# Patient Record
Sex: Female | Born: 1960 | Race: White | Hispanic: No | Marital: Married | Smoking: Never smoker
Health system: Southern US, Community
[De-identification: ages and names within clinical notes are randomized; demographics above are authoritative.]

## PROBLEM LIST (undated history)

## (undated) DIAGNOSIS — I10 Essential (primary) hypertension: Secondary | ICD-10-CM

## (undated) HISTORY — PX: ABDOMINAL SURGERY: SHX537

---

## 2004-04-21 ENCOUNTER — Emergency Department: Payer: Self-pay | Admitting: Emergency Medicine

## 2005-03-29 ENCOUNTER — Emergency Department: Payer: Self-pay | Admitting: Emergency Medicine

## 2005-08-10 ENCOUNTER — Ambulatory Visit: Payer: Self-pay | Admitting: Family Medicine

## 2006-05-14 ENCOUNTER — Emergency Department: Payer: Self-pay | Admitting: Emergency Medicine

## 2007-04-06 ENCOUNTER — Emergency Department: Payer: Self-pay | Admitting: Emergency Medicine

## 2007-05-10 ENCOUNTER — Ambulatory Visit: Payer: Self-pay | Admitting: Family Medicine

## 2007-12-12 ENCOUNTER — Ambulatory Visit: Payer: Self-pay

## 2008-10-07 ENCOUNTER — Emergency Department: Payer: Self-pay | Admitting: Emergency Medicine

## 2009-03-18 ENCOUNTER — Ambulatory Visit: Payer: Self-pay

## 2009-06-17 ENCOUNTER — Emergency Department: Payer: Self-pay | Admitting: Unknown Physician Specialty

## 2010-07-14 ENCOUNTER — Ambulatory Visit: Payer: Self-pay

## 2011-08-16 ENCOUNTER — Ambulatory Visit: Payer: Self-pay

## 2012-06-20 ENCOUNTER — Emergency Department: Payer: Self-pay | Admitting: Emergency Medicine

## 2012-06-20 LAB — HEMOGLOBIN: HGB: 12.6 g/dL (ref 12.0–16.0)

## 2012-12-26 ENCOUNTER — Ambulatory Visit: Payer: Self-pay

## 2013-07-16 ENCOUNTER — Emergency Department: Payer: Self-pay | Admitting: Emergency Medicine

## 2013-07-16 LAB — CBC WITH DIFFERENTIAL/PLATELET
Basophil #: 0.1 10*3/uL (ref 0.0–0.1)
Basophil %: 1 %
EOS PCT: 2 %
Eosinophil #: 0.1 10*3/uL (ref 0.0–0.7)
HCT: 43.8 % (ref 35.0–47.0)
HGB: 14.8 g/dL (ref 12.0–16.0)
Lymphocyte #: 2 10*3/uL (ref 1.0–3.6)
Lymphocyte %: 31.9 %
MCH: 29.7 pg (ref 26.0–34.0)
MCHC: 33.8 g/dL (ref 32.0–36.0)
MCV: 88 fL (ref 80–100)
MONOS PCT: 6.1 %
Monocyte #: 0.4 x10 3/mm (ref 0.2–0.9)
Neutrophil #: 3.7 10*3/uL (ref 1.4–6.5)
Neutrophil %: 59 %
PLATELETS: 177 10*3/uL (ref 150–440)
RBC: 4.98 10*6/uL (ref 3.80–5.20)
RDW: 13.5 % (ref 11.5–14.5)
WBC: 6.3 10*3/uL (ref 3.6–11.0)

## 2013-07-16 LAB — URINALYSIS, COMPLETE
BILIRUBIN, UR: NEGATIVE
BLOOD: NEGATIVE
Bacteria: NONE SEEN
Glucose,UR: NEGATIVE mg/dL (ref 0–75)
KETONE: NEGATIVE
Leukocyte Esterase: NEGATIVE
Nitrite: NEGATIVE
PH: 5 (ref 4.5–8.0)
Protein: NEGATIVE
Specific Gravity: 1.019 (ref 1.003–1.030)
Squamous Epithelial: 3
WBC UR: 1 /HPF (ref 0–5)

## 2013-07-16 LAB — BASIC METABOLIC PANEL
ANION GAP: 3 — AB (ref 7–16)
BUN: 13 mg/dL (ref 7–18)
CO2: 30 mmol/L (ref 21–32)
Calcium, Total: 9.4 mg/dL (ref 8.5–10.1)
Chloride: 102 mmol/L (ref 98–107)
Creatinine: 0.6 mg/dL (ref 0.60–1.30)
EGFR (African American): >60
GLUCOSE: 99 mg/dL (ref 65–99)
Osmolality: 270 (ref 275–301)
Potassium: 4.2 mmol/L (ref 3.5–5.1)
SODIUM: 135 mmol/L — AB (ref 136–145)

## 2013-07-16 LAB — TROPONIN I

## 2014-03-05 ENCOUNTER — Ambulatory Visit: Payer: Self-pay

## 2014-08-06 ENCOUNTER — Emergency Department: Payer: Self-pay | Admitting: Emergency Medicine

## 2018-03-12 ENCOUNTER — Other Ambulatory Visit: Payer: Self-pay | Admitting: Family Medicine

## 2018-03-12 DIAGNOSIS — Z1231 Encounter for screening mammogram for malignant neoplasm of breast: Secondary | ICD-10-CM

## 2018-03-26 ENCOUNTER — Encounter: Payer: Self-pay | Admitting: Radiology

## 2018-03-26 ENCOUNTER — Ambulatory Visit
Admission: RE | Admit: 2018-03-26 | Discharge: 2018-03-26 | Disposition: A | Discharge status: Home / Self Care | Payer: BLUE CROSS/BLUE SHIELD | Source: Ambulatory Visit | Attending: Family Medicine | Admitting: Family Medicine

## 2018-03-26 DIAGNOSIS — Z1231 Encounter for screening mammogram for malignant neoplasm of breast: Secondary | ICD-10-CM | POA: Diagnosis not present

## 2018-11-09 ENCOUNTER — Encounter: Payer: Self-pay | Admitting: Emergency Medicine

## 2018-11-09 ENCOUNTER — Other Ambulatory Visit: Payer: Self-pay

## 2018-11-09 ENCOUNTER — Emergency Department
Admission: EM | Admit: 2018-11-09 | Discharge: 2018-11-09 | Disposition: A | Discharge status: Home / Self Care | Payer: BLUE CROSS/BLUE SHIELD | Attending: Emergency Medicine | Admitting: Emergency Medicine

## 2018-11-09 ENCOUNTER — Emergency Department: Payer: BLUE CROSS/BLUE SHIELD

## 2018-11-09 DIAGNOSIS — R0789 Other chest pain: Secondary | ICD-10-CM | POA: Diagnosis not present

## 2018-11-09 DIAGNOSIS — I10 Essential (primary) hypertension: Secondary | ICD-10-CM | POA: Insufficient documentation

## 2018-11-09 DIAGNOSIS — R079 Chest pain, unspecified: Secondary | ICD-10-CM | POA: Diagnosis present

## 2018-11-09 LAB — BASIC METABOLIC PANEL
Anion gap: 11 (ref 5–15)
BUN: 19 mg/dL (ref 6–20)
CO2: 24 mmol/L (ref 22–32)
Calcium: 8.8 mg/dL — ABNORMAL LOW (ref 8.9–10.3)
Chloride: 104 mmol/L (ref 98–111)
Creatinine, Ser: 0.45 mg/dL (ref 0.44–1.00)
GFR calc Af Amer: >60 mL/min (ref >60)
GFR calc non Af Amer: >60 mL/min (ref >60)
Glucose, Bld: 170 mg/dL — ABNORMAL HIGH (ref 70–99)
Potassium: 3.6 mmol/L (ref 3.5–5.1)
Sodium: 139 mmol/L (ref 135–145)

## 2018-11-09 LAB — CBC WITH DIFFERENTIAL/PLATELET
Abs Immature Granulocytes: 0.02 10*3/uL (ref 0.00–0.07)
Basophils Absolute: 0 10*3/uL (ref 0.0–0.1)
Basophils Relative: 1 %
Eosinophils Absolute: 0.1 10*3/uL (ref 0.0–0.5)
Eosinophils Relative: 2 %
HCT: 39.1 % (ref 36.0–46.0)
Hemoglobin: 13.2 g/dL (ref 12.0–15.0)
Immature Granulocytes: 0 %
Lymphocytes Relative: 30 %
Lymphs Abs: 1.5 10*3/uL (ref 0.7–4.0)
MCH: 29.7 pg (ref 26.0–34.0)
MCHC: 33.8 g/dL (ref 30.0–36.0)
MCV: 87.9 fL (ref 80.0–100.0)
Monocytes Absolute: 0.4 10*3/uL (ref 0.1–1.0)
Monocytes Relative: 7 %
Neutro Abs: 3.1 10*3/uL (ref 1.7–7.7)
Neutrophils Relative %: 60 %
Platelets: 136 10*3/uL — ABNORMAL LOW (ref 150–400)
RBC: 4.45 MIL/uL (ref 3.87–5.11)
RDW: 13.2 % (ref 11.5–15.5)
WBC: 5.1 10*3/uL (ref 4.0–10.5)
nRBC: 0 % (ref 0.0–0.2)

## 2018-11-09 LAB — TROPONIN I: Troponin I: <0.03 ng/mL (ref <0.03)

## 2018-11-09 NOTE — ED Provider Notes (Signed)
[facility] Emergency Department Provider Note  ____________________________________________  Time seen: Approximately 11:45 AM  I have reviewed the triage vital signs and the nursing notes.   HISTORY  Chief Complaint Numbness and Shortness of Breath    HPI Rose Noble is a 58 y.o. female with a past medical history of hypertension who comes the ED complaining of shortness of breath, vague anterior chest pain, and paresthesia of the right arm for the past week.  Onset of symptoms was 1 week ago several hours after she had been doing a lot of strenuous yard work lifting and moving heavy bags of mulch just brought around her plants.  She did not have any significant symptoms or discomfort during the activity itself, but later in the day she developed anterior chest soreness and feeling more short of breath.  Denies cough fevers chills or body aches, no sick contacts.  Pain is not exertional, not radiating.  Not pleuritic.      Past medical history significant for hypertension  There are no active problems to display for this patient.    Past surgical history noncontributory   Prior to Admission medications   Not on File  Benazepril Hydrochlorothiazide   Allergies Patient has no known allergies.   Family History  Problem Relation Age of Onset  . Breast cancer Maternal Aunt   . Breast cancer Maternal Aunt     Social History Social History   Tobacco Use  . Smoking status: Never Smoker  . Smokeless tobacco: Never Used  Substance Use Topics  . Alcohol use: Never    Frequency: Never  . Drug use: Never    Review of Systems  Constitutional:   No fever or chills.  ENT:   No sore throat. No rhinorrhea. Cardiovascular: Positive as above chest discomfort without syncope. Respiratory:   Positive intermittent shortness of breath without cough. Gastrointestinal:   Negative for abdominal pain, vomiting and diarrhea.  Musculoskeletal:    Negative for focal pain or swelling All other systems reviewed and are negative except as documented above in ROS and HPI.  ____________________________________________   PHYSICAL EXAM:  VITAL SIGNS: ED Triage Vitals  Enc Vitals Group     BP 11/09/18 1014 (!) 162/86     Pulse Rate 11/09/18 1014 83     Resp 11/09/18 1014 18     Temp 11/09/18 1014 98.1 F (36.7 C)     Temp Source 11/09/18 1014 Oral     SpO2 11/09/18 1014 97 %     Weight 11/09/18 1008 160 lb (72.6 kg)     Height 11/09/18 1008 5\' 1"  (1.549 m)     Head Circumference --      Peak Flow --      Pain Score 11/09/18 1008 0     Pain Loc --      Pain Edu? --      Excl. in [place]? --     Vital signs reviewed, nursing assessments reviewed.   Constitutional:   Alert and oriented. Non-toxic appearance. Eyes:   Conjunctivae are normal. EOMI. PERRL. ENT      Head:   Normocephalic and atraumatic.      Nose:   No congestion/rhinnorhea.       Mouth/Throat:   MMM, no pharyngeal erythema. No peritonsillar mass.       Neck:   No meningismus. Full ROM. Hematological/Lymphatic/Immunilogical:   No cervical lymphadenopathy. Cardiovascular:   RRR. Symmetric bilateral radial and DP pulses.  No murmurs. Cap refill  less than 2 seconds. Respiratory:   Normal respiratory effort without tachypnea/retractions. Breath sounds are clear and equal bilaterally. No wheezes/rales/rhonchi. Gastrointestinal:   Soft and nontender. Non distended. There is no CVA tenderness.  No rebound, rigidity, or guarding. Musculoskeletal:   Normal range of motion in all extremities. No joint effusions.  No lower extremity tenderness.  No edema. Neurologic:   Normal speech and language.  Motor grossly intact. No acute focal neurologic deficits are appreciated.  Skin:    Skin is warm, dry and intact. No rash noted.  No petechiae, purpura, or bullae.  ____________________________________________    LABS (pertinent positives/negatives) (all labs ordered are  listed, but only abnormal results are displayed) Labs Reviewed  BASIC METABOLIC PANEL - Abnormal; Notable for the following components:      Result Value   Glucose, Bld 170 (*)    Calcium 8.8 (*)    All other components within normal limits  CBC WITH DIFFERENTIAL/PLATELET - Abnormal; Notable for the following components:   Platelets 136 (*)    All other components within normal limits  TROPONIN I   ____________________________________________   EKG  Interpreted by me  Date: 11/09/2018  Rate: 74  Rhythm: normal sinus rhythm  QRS Axis: normal  Intervals: normal  ST/T Wave abnormalities: normal  Conduction Disutrbances: none  Narrative Interpretation: unremarkable      ____________________________________________    RADIOLOGY  Dg Chest Portable 1 View  Result Date: 11/09/2018 CLINICAL DATA:  Shortness of breath, numbness in the right arm for 1-2 weeks EXAM: PORTABLE CHEST 1 VIEW COMPARISON:  06/17/2009 FINDINGS: The heart size and mediastinal contours are within normal limits. Both lungs are clear. The visualized skeletal structures are unremarkable. IMPRESSION: No active disease. Electronically Signed   By: Elige Ko   On: 11/09/2018 11:21    ____________________________________________   PROCEDURES Procedures  ____________________________________________  DIFFERENTIAL DIAGNOSIS   Muscle strain, fatigue, non-STEMI, pneumonia  CLINICAL IMPRESSION / ASSESSMENT AND PLAN / ED COURSE  Medications ordered in the ED: Medications - No data to display  Pertinent labs & imaging results that were available during my care of the patient were reviewed by me and considered in my medical decision making (see chart for details).  Rose Noble was evaluated in Emergency Department on 11/09/2018 for the symptoms described in the history of present illness. She was evaluated in the context of the global COVID-19 pandemic, which necessitated consideration that the patient  might be at risk for infection with the SARS-CoV-2 virus that causes COVID-19. Institutional protocols and algorithms that pertain to the evaluation of patients at risk for COVID-19 are in a state of rapid change based on information released by regulatory bodies including the CDC and federal and state organizations. These policies and algorithms were followed during the patient's care in the ED.   Patient presents with atypical chest discomfort, most likely muscle strain related to yard work.  Vital signs unremarkable.  EKG and chest x-ray unremarkable.  Labs are all normal.  Given symptoms for greater than a week, the negative work-up is very reassuring and no further cardiac work-up is needed at this time.Considering the patient's symptoms, medical history, and physical examination today, I have low suspicion for ACS, PE, TAD, pneumothorax, carditis, mediastinitis, pneumonia, CHF, or sepsis.  Plan for discharge home and follow-up with primary care and cardiology.      ____________________________________________   FINAL CLINICAL IMPRESSION(S) / ED DIAGNOSES    Final diagnoses:  Atypical chest pain  ED Discharge Orders    None      Portions of this note were generated with dragon dictation software. Dictation errors may occur despite best attempts at proofreading.   Sharman Cheek, MD 11/09/18 1149

## 2018-11-09 NOTE — Discharge Instructions (Signed)
Your EKG, labs, and chest x-ray are all okay today.  Please follow-up with your primary care doctor for continued monitoring of your symptoms.  Please follow-up with cardiology for further evaluation of your risk of heart disease.

## 2018-11-09 NOTE — ED Triage Notes (Signed)
C/o SHOB and numbness in right arm for 1-2 weeks. Thinks may have pulled something working in yard. Unlabored at check in. Ambulatory. No chest pain.

## 2018-11-12 ENCOUNTER — Telehealth: Payer: Self-pay

## 2018-11-12 NOTE — Telephone Encounter (Signed)
Spoke with patient to schedule [facility] ed fu chest pain.  Ok to add on per dr. [name] to this afternoon as a new virtual.  Patient driving and will call back to schedule.

## 2019-04-08 ENCOUNTER — Other Ambulatory Visit: Payer: Self-pay | Admitting: Family Medicine

## 2019-04-08 DIAGNOSIS — Z1231 Encounter for screening mammogram for malignant neoplasm of breast: Secondary | ICD-10-CM

## 2019-07-10 ENCOUNTER — Ambulatory Visit
Admission: RE | Admit: 2019-07-10 | Discharge: 2019-07-10 | Disposition: A | Discharge status: Home / Self Care | Payer: BLUE CROSS/BLUE SHIELD | Source: Ambulatory Visit | Attending: Family Medicine | Admitting: Family Medicine

## 2019-07-10 DIAGNOSIS — Z1231 Encounter for screening mammogram for malignant neoplasm of breast: Secondary | ICD-10-CM | POA: Insufficient documentation

## 2019-12-10 ENCOUNTER — Emergency Department
Admission: EM | Admit: 2019-12-10 | Discharge: 2019-12-10 | Disposition: A | Discharge status: Home / Self Care | Payer: Worker's Compensation | Attending: Student in an Organized Health Care Education/Training Program | Admitting: Student in an Organized Health Care Education/Training Program

## 2019-12-10 ENCOUNTER — Emergency Department: Payer: Worker's Compensation

## 2019-12-10 ENCOUNTER — Other Ambulatory Visit: Payer: Self-pay

## 2019-12-10 DIAGNOSIS — Y990 Civilian activity done for income or pay: Secondary | ICD-10-CM | POA: Diagnosis not present

## 2019-12-10 DIAGNOSIS — M79604 Pain in right leg: Secondary | ICD-10-CM

## 2019-12-10 DIAGNOSIS — Y9289 Other specified places as the place of occurrence of the external cause: Secondary | ICD-10-CM | POA: Diagnosis not present

## 2019-12-10 DIAGNOSIS — W01198A Fall on same level from slipping, tripping and stumbling with subsequent striking against other object, initial encounter: Secondary | ICD-10-CM | POA: Diagnosis not present

## 2019-12-10 DIAGNOSIS — Y9389 Activity, other specified: Secondary | ICD-10-CM | POA: Insufficient documentation

## 2019-12-10 MED ORDER — IBUPROFEN 600 MG PO TABS: 600.0000 mg | ORAL_TABLET | Freq: Three times a day (TID) | ORAL | 0 refills | Status: AC | PRN

## 2019-12-10 MED ORDER — TRAMADOL HCL 50 MG PO TABS: 50.0000 mg | ORAL_TABLET | Freq: Four times a day (QID) | ORAL | 0 refills | Status: AC | PRN

## 2019-12-10 NOTE — ED Triage Notes (Signed)
Patient to ER for c/o right leg pain (just below knee) after falling at work. Patient states a customer had their foot out in the aisle, patient tripped over foot and fell. No obvious deformity present.  Patient's manager, Raynelle Fanning @ [employer], states no testing needed for [employer] [phone]).

## 2019-12-10 NOTE — ED Provider Notes (Signed)
[facility] Emergency Department Provider Note   ____________________________________________   First MD Initiated Contact with Patient 12/10/19 1344     (approximate)  I have reviewed the triage vital signs and the nursing notes.   HISTORY  Chief Complaint Leg Pain    HPI Rose Noble is a 59 y.o. female patient complain of right leg pain secondary to a trip and fall.  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  Patient said a customer had his foot out and she tripped over it.  Patient day prior to arrival she had a large hematoma which is receding now.  Patient is able to weight-bear.  Patient rates the pain as a 6/10.  Patient described the pain as "achy".  Ice pack applied at work.     History reviewed. No pertinent past medical history.  There are no problems to display for this patient.   History reviewed. No pertinent surgical history.  Prior to Admission medications   Medication Sig Start Date End Date Taking? Authorizing Provider  ibuprofen (ADVIL) 600 MG tablet Take 1 tablet (600 mg total) by mouth every 8 (eight) hours as needed. 12/10/19   Sable Feil, PA-C  traMADol (ULTRAM) 50 MG tablet Take 1 tablet (50 mg total) by mouth every 6 (six) hours as needed for up to 3 days. 12/10/19 12/13/19  Sable Feil, PA-C    Allergies Patient has no known allergies.  Family History  Problem Relation Age of Onset   Breast cancer Maternal Aunt    Breast cancer Maternal Aunt     Social History Social History   Tobacco Use   Smoking status: Never Smoker   Smokeless tobacco: Never Used  Substance Use Topics   Alcohol use: Never   Drug use: Never    Review of Systems Constitutional: No fever/chills Eyes: No visual changes. ENT: No sore throat. Cardiovascular: Denies chest pain. Respiratory: Denies shortness of breath. Gastrointestinal: No abdominal pain.  No nausea, no vomiting.  No diarrhea.  No  constipation. Genitourinary: Negative for dysuria. Musculoskeletal: Right leg pain. Skin: Negative for rash. Neurological: Negative for headaches, focal weakness or numbness.   ____________________________________________   PHYSICAL EXAM:  VITAL SIGNS: ED Triage Vitals  Enc Vitals Group     BP 12/10/19 1333 (!) 165/77     Pulse Rate 12/10/19 1333 78     Resp 12/10/19 1333 18     Temp 12/10/19 1333 98.6 F (37 C)     Temp Source 12/10/19 1333 Oral     SpO2 12/10/19 1333 96 %     Weight 12/10/19 1329 157 lb (71.2 kg)     Height 12/10/19 1329 5\' 1"  (1.549 m)     Head Circumference --      Peak Flow --      Pain Score 12/10/19 1328 6     Pain Loc --      Pain Edu? --      Excl. in [place]? --    Constitutional: Alert and oriented. Well appearing and in no acute distress. Cardiovascular: Normal rate, regular rhythm. Grossly normal heart sounds.  Good peripheral circulation.  Elevated blood pressure. Respiratory: Normal respiratory effort.  No retractions. Lungs CTAB. Musculoskeletal: No obvious deformity to the right tib-fib.  Patient is moderate guarding palpation distal third right tib-fib area.   Neurologic:  Normal speech and language. No gross focal neurologic deficits are appreciated. No gait instability. Skin:  Skin is warm, dry and intact.  No rash noted. Psychiatric: Mood and affect are normal. Speech and behavior are normal.  ____________________________________________   LABS (all labs ordered are listed, but only abnormal results are displayed)  Labs Reviewed - No data to display ____________________________________________  EKG   ____________________________________________  RADIOLOGY  ED MD interpretation:    Official radiology report(s): No results found.  ____________________________________________   PROCEDURES  Procedure(s) performed (including Critical Care):  Procedures   ____________________________________________   INITIAL IMPRESSION  / ASSESSMENT AND PLAN / ED COURSE  As part of my medical decision making, I reviewed the following data within the electronic MEDICAL RECORD NUMBER     Patient presents with distal surgery right leg pain secondary to a trip and fall.  Discussed x-ray findings revealing no fractures.  Patient complaint physical exam consistent with contusion.  Patient given discharge care instruction and a work note.  Patient advised follow-up PCP if no improvement 2 to 3 days.    Jenniger Figiel was evaluated in Emergency Department on 12/10/2019 for the symptoms described in the history of present illness. She was evaluated in the context of the global COVID-19 pandemic, which necessitated consideration that the patient might be at risk for infection with the SARS-CoV-2 virus that causes COVID-19. Institutional protocols and algorithms that pertain to the evaluation of patients at risk for COVID-19 are in a state of rapid change based on information released by regulatory bodies including the CDC and federal and state organizations. These policies and algorithms were followed during the patient's care in the ED.       ____________________________________________   FINAL CLINICAL IMPRESSION(S) / ED DIAGNOSES  Final diagnoses:  Right leg pain     ED Discharge Orders         Ordered    ibuprofen (ADVIL) 600 MG tablet  Every 8 hours PRN     12/10/19 1435    traMADol (ULTRAM) 50 MG tablet  Every 6 hours PRN     12/10/19 1435           Note:  This document was prepared using Dragon voice recognition software and may include unintentional dictation errors.    Joni Reining, PA-C 12/10/19 1440    Willy Eddy, MD 12/10/19 1539

## 2019-12-10 NOTE — Discharge Instructions (Signed)
Follow discharge care instruction take medication as directed. °

## 2019-12-10 NOTE — ED Notes (Signed)
See triage note  Presents s/p trip and fall    Having pain to right lower leg

## 2020-03-28 ENCOUNTER — Encounter: Payer: Self-pay | Admitting: Emergency Medicine

## 2020-03-28 ENCOUNTER — Emergency Department
Admission: EM | Admit: 2020-03-28 | Discharge: 2020-03-28 | Disposition: A | Discharge status: Home / Self Care | Payer: BLUE CROSS/BLUE SHIELD | Attending: Emergency Medicine | Admitting: Emergency Medicine

## 2020-03-28 ENCOUNTER — Other Ambulatory Visit: Payer: Self-pay

## 2020-03-28 ENCOUNTER — Emergency Department: Payer: BLUE CROSS/BLUE SHIELD

## 2020-03-28 DIAGNOSIS — I10 Essential (primary) hypertension: Secondary | ICD-10-CM | POA: Insufficient documentation

## 2020-03-28 DIAGNOSIS — R55 Syncope and collapse: Secondary | ICD-10-CM | POA: Insufficient documentation

## 2020-03-28 DIAGNOSIS — H8111 Benign paroxysmal vertigo, right ear: Secondary | ICD-10-CM | POA: Diagnosis not present

## 2020-03-28 DIAGNOSIS — R42 Dizziness and giddiness: Secondary | ICD-10-CM | POA: Diagnosis present

## 2020-03-28 HISTORY — DX: Essential (primary) hypertension: I10

## 2020-03-28 LAB — BASIC METABOLIC PANEL
Anion gap: 9 (ref 5–15)
BUN: 16 mg/dL (ref 6–20)
CO2: 27 mmol/L (ref 22–32)
Calcium: 9.8 mg/dL (ref 8.9–10.3)
Chloride: 102 mmol/L (ref 98–111)
Creatinine, Ser: 0.57 mg/dL (ref 0.44–1.00)
GFR calc Af Amer: >60 mL/min (ref >60)
GFR calc non Af Amer: >60 mL/min (ref >60)
Glucose, Bld: 110 mg/dL — ABNORMAL HIGH (ref 70–99)
Potassium: 4 mmol/L (ref 3.5–5.1)
Sodium: 138 mmol/L (ref 135–145)

## 2020-03-28 LAB — CBC
HCT: 42 % (ref 36.0–46.0)
Hemoglobin: 14.4 g/dL (ref 12.0–15.0)
MCH: 30.3 pg (ref 26.0–34.0)
MCHC: 34.3 g/dL (ref 30.0–36.0)
MCV: 88.2 fL (ref 80.0–100.0)
Platelets: 142 K/uL — ABNORMAL LOW (ref 150–400)
RBC: 4.76 MIL/uL (ref 3.87–5.11)
RDW: 13.1 % (ref 11.5–15.5)
WBC: 5.5 K/uL (ref 4.0–10.5)
nRBC: 0 % (ref 0.0–0.2)

## 2020-03-28 LAB — TROPONIN I (HIGH SENSITIVITY)
Troponin I (High Sensitivity): 4 ng/L (ref <18)
Troponin I (High Sensitivity): 5 ng/L

## 2020-03-28 MED ORDER — MECLIZINE HCL 25 MG PO TABS
25.0000 mg | ORAL_TABLET | Freq: Once | ORAL | Status: AC
  Administered 2020-03-28: 25 mg via ORAL
  Filled 2020-03-28: qty 1

## 2020-03-28 MED ORDER — MECLIZINE HCL 25 MG PO TABS: 25.0000 mg | ORAL_TABLET | Freq: Three times a day (TID) | ORAL | 0 refills | Status: AC | PRN

## 2020-03-28 NOTE — ED Triage Notes (Signed)
Pt to ED via POV, pt states that the last 2 days she has been having dizzy spells. Pt states that feels like her BP is elevated. Pt reports blood pressure at home was 170/110 but unsure if her cuff is accurate. Pt reports that she just doesn't feel right. Pt denies visual changes, unilateral numbess/tingling. Pt states that she has not slept well over the past few nights. Pt is in NAD.

## 2020-03-28 NOTE — Discharge Instructions (Addendum)
You were seen in the ED because of your intermittent dizziness at home.  Thankfully, there is no evidence of stroke or more serious illness.  This is likely a benign form of vertigo that has to do with your inner ear, as we discussed.  You are being discharged a prescription for a medicine called meclizine/Antivert to be used as needed 3-4 times per day for your dizziness.  It is safe to drive and work with this medication.  As we discussed, if you develop any worsening dizziness that will not go away while seated.  Any fevers or passing out related to your dizziness, please return to the ED.

## 2020-03-28 NOTE — ED Provider Notes (Signed)
[facility] Emergency Department Provider Note ____________________________________________   First MD Initiated Contact with Patient 03/28/20 1805     (approximate)  I have reviewed the triage vital signs and the nursing notes.  HISTORY  Chief Complaint Dizziness   HPI Dhrithi Riche is a 59 y.o. femalewho presents to the ED for evaluation of dizziness.   Chart review indicates hx HTN Patient self-reports a history of remote vertigo.    Patient reports accidentally hitting her head at home 3-4 days ago.  She denies any significant trauma, says she "just lightly knocked it."  She denies falling to the ground, syncope, vomiting or significant headache.   Patient reports that for the past 2 days she has had intermittent dizziness.  She reports an episode of dizziness lasting about 30 minutes yesterday when she got out of her car, and she reports this felt like she was "about to pass out."  She further reports poor sleeping at night and then an episode of dizziness after standing today that she reports is being combination of presyncopal and vertiginous dizziness.  This lasted about 1.5 hours prior to self resolving.  She is not taking any medications to help with this.  She is in no pain.  No headache.  No syncope or vomiting.  She reports feeling okay right now and has no significant dizziness.  She reports coming into the ED because of her concern for high blood pressure at home concurrent with her dizziness spell.  She reports blood pressure up to 170/100 at home.  At the end of our conversation patient independently ambulates to the bathroom and reports feeling well.    Past Medical History:  Diagnosis Date  . Hypertension     There are no problems to display for this patient.   Past Surgical History:  Procedure Laterality Date  . ABDOMINAL SURGERY      Prior to Admission medications   Medication Sig Start Date End Date Taking? Authorizing  Provider  ibuprofen (ADVIL) 600 MG tablet Take 1 tablet (600 mg total) by mouth every 8 (eight) hours as needed. 12/10/19   Joni Reining, PA-C  meclizine (ANTIVERT) 25 MG tablet Take 1 tablet (25 mg total) by mouth 3 (three) times daily as needed for dizziness. 03/28/20   Delton Prairie, MD    Allergies Patient has no known allergies.  Family History  Problem Relation Age of Onset  . Breast cancer Maternal Aunt   . Breast cancer Maternal Aunt     Social History Social History   Tobacco Use  . Smoking status: Never Smoker  . Smokeless tobacco: Never Used  Substance Use Topics  . Alcohol use: Never  . Drug use: Never    Review of Systems  Constitutional: No fever/chills Eyes: No visual changes. ENT: No sore throat. Cardiovascular: Denies chest pain. Respiratory: Denies shortness of breath. Gastrointestinal: No abdominal pain.  No nausea, no vomiting.  No diarrhea.  No constipation. Genitourinary: Negative for dysuria. Musculoskeletal: Negative for back pain. Skin: Negative for rash. Neurological: Negative for headaches, focal weakness or numbness.  Positive for dizziness.  ____________________________________________   PHYSICAL EXAM:  VITAL SIGNS: Vitals:   03/28/20 1612 03/28/20 1854  BP: (!) 176/91 (!) 155/82  Pulse: 83 76  Resp: 20 16  Temp:    SpO2: 97% 94%      Constitutional: Alert and oriented. Well appearing and in no acute distress. Eyes: Conjunctivae are normal. PERRL. EOMI. right-sided horizontal EOM causes nystagmus. Head: Atraumatic.  Nose: No congestion/rhinnorhea. Mouth/Throat: Mucous membranes are moist.  Oropharynx non-erythematous. Neck: No stridor. No cervical spine tenderness to palpation. Cardiovascular: Normal rate, regular rhythm. Grossly normal heart sounds.  Good peripheral circulation. Respiratory: Normal respiratory effort.  No retractions. Lungs CTAB. Gastrointestinal: Soft , nondistended, nontender to palpation. No abdominal  bruits. No CVA tenderness. Musculoskeletal: No lower extremity tenderness nor edema.  No joint effusions. No signs of acute trauma. Neurologic:  Normal speech and language. No gross focal neurologic deficits are appreciated. No gait instability noted. Cranial nerves II through XII intact 5/5 strength and sensation in all 4 extremities Ambulatory independently with a normal gait. Skin:  Skin is warm, dry and intact. No rash noted. Psychiatric: Mood and affect are normal. Speech and behavior are normal.  ____________________________________________   LABS (all labs ordered are listed, but only abnormal results are displayed)  Labs Reviewed  BASIC METABOLIC PANEL - Abnormal; Notable for the following components:      Result Value   Glucose, Bld 110 (*)    All other components within normal limits  CBC - Abnormal; Notable for the following components:   Platelets 142 (*)    All other components within normal limits  TROPONIN I (HIGH SENSITIVITY)  TROPONIN I (HIGH SENSITIVITY)   ____________________________________________  12 Lead EKG  Sinus rhythm, rate of 72 bpm.  Normal axis and intervals.  No evidence of acute ischemia. ____________________________________________  RADIOLOGY  ED MD interpretation: 2 view CXR without evidence of acute cardiopulmonary pathology  CT head reviewed and without evidence of acute intracranial pathology.  Official radiology report(s): DG Chest 2 View  Result Date: 03/28/2020 CLINICAL DATA:  Dizziness, cardiac word a cup EXAM: CHEST - 2 VIEW COMPARISON:  Nov 09, 2018 FINDINGS: The heart size and mediastinal contours are within normal limits. Both lungs are clear. No pleural effusions. No pneumothorax. The visualized skeletal structures are unremarkable. IMPRESSION: No acute cardiopulmonary disease. Electronically Signed   By: Feliberto Harts MD   On: 03/28/2020 12:41   CT Head Wo Contrast  Result Date: 03/28/2020 CLINICAL DATA:  Dizziness.   Elevated blood pressure. EXAM: CT HEAD WITHOUT CONTRAST TECHNIQUE: Contiguous axial images were obtained from the base of the skull through the vertex without intravenous contrast. COMPARISON:  MRI brain 05/10/2007 FINDINGS: Brain: No evidence of acute infarction, hemorrhage, hydrocephalus, extra-axial collection or mass lesion/mass effect. Vascular: No hyperdense vessel or unexpected calcification. Skull: The calvarium appears intact. Sinuses/Orbits: Paranasal sinuses and mastoid air cells are clear. Other: None. IMPRESSION: No acute intracranial abnormalities. Electronically Signed   By: Burman Nieves M.D.   On: 03/28/2020 19:10    ____________________________________________   PROCEDURES and INTERVENTIONS  Procedure(s) performed (including Critical Care):  Procedures  Medications  meclizine (ANTIVERT) tablet 25 mg (25 mg Oral Given 03/28/20 1854)    ____________________________________________   MDM / ED COURSE  59 year old woman with history of vertigo presents with intermittent dizziness most consistent with BPPV and amenable to outpatient management.  Minimal hypertension, otherwise normal vital signs on room air.  Reassuring examination without evidence of neurovascular deficits, distress or trauma.  She does have right-sided horizontal nystagmus with reproduction of some of her systems with EOM movement to the right.  Blood work is reassuring.  EKG is nonischemic.  CT imaging without evidence of ICH or evidence of acute pathology.  Her symptoms resolve after meclizine administration, she is ambulatory and tolerating p.o. intake without complication.  Her symptoms are most consistent with BPPV.  I provided her with a  prescription for meclizine and we discussed outpatient management.  We discussed return precautions for the ED and following up with her PCP.  Patient medically stable discharge home.  Clinical Course as of Mar 29 1931  Sat Mar 28, 2020  1927 Reassessed.  Patient  reports improving symptoms.  We discussed outpatient management and return precautions for the ED.   [DS]    Clinical Course User Index [DS] Delton Prairie, MD     ____________________________________________   FINAL CLINICAL IMPRESSION(S) / ED DIAGNOSES  Final diagnoses:  Vertigo  Near syncope  Dizziness  Benign paroxysmal positional vertigo of right ear     ED Discharge Orders         Ordered    meclizine (ANTIVERT) 25 MG tablet  3 times daily PRN        03/28/20 1929           Barbara Ahart Katrinka Blazing   Note:  This document was prepared using Dragon voice recognition software and may include unintentional dictation errors.   Delton Prairie, MD 03/28/20 [phone]

## 2020-03-28 NOTE — ED Notes (Signed)
Pt ambulatory to treatment room with steady gait noted.  

## 2020-03-28 NOTE — ED Notes (Signed)
Pt to CT

## 2020-11-04 ENCOUNTER — Other Ambulatory Visit: Payer: Self-pay | Admitting: Family Medicine

## 2020-11-04 DIAGNOSIS — Z1231 Encounter for screening mammogram for malignant neoplasm of breast: Secondary | ICD-10-CM

## 2021-08-09 ENCOUNTER — Ambulatory Visit
Admission: RE | Admit: 2021-08-09 | Discharge: 2021-08-09 | Disposition: A | Discharge status: Home / Self Care | Payer: BLUE CROSS/BLUE SHIELD | Source: Ambulatory Visit | Attending: Family Medicine | Admitting: Family Medicine

## 2021-08-09 ENCOUNTER — Other Ambulatory Visit: Payer: Self-pay

## 2021-08-09 DIAGNOSIS — Z1231 Encounter for screening mammogram for malignant neoplasm of breast: Secondary | ICD-10-CM | POA: Insufficient documentation

## 2022-07-12 ENCOUNTER — Other Ambulatory Visit: Payer: Self-pay | Admitting: Family Medicine

## 2022-07-12 DIAGNOSIS — Z1231 Encounter for screening mammogram for malignant neoplasm of breast: Secondary | ICD-10-CM

## 2022-08-15 ENCOUNTER — Ambulatory Visit
Admission: RE | Admit: 2022-08-15 | Discharge: 2022-08-15 | Disposition: A | Discharge status: Home / Self Care | Payer: BLUE CROSS/BLUE SHIELD | Source: Ambulatory Visit | Attending: Family Medicine | Admitting: Family Medicine

## 2022-08-15 DIAGNOSIS — Z1231 Encounter for screening mammogram for malignant neoplasm of breast: Secondary | ICD-10-CM | POA: Diagnosis present

## 2023-08-04 ENCOUNTER — Other Ambulatory Visit: Payer: Self-pay | Admitting: Family Medicine

## 2023-08-04 DIAGNOSIS — Z1231 Encounter for screening mammogram for malignant neoplasm of breast: Secondary | ICD-10-CM

## 2023-08-28 ENCOUNTER — Ambulatory Visit
Admission: RE | Admit: 2023-08-28 | Discharge: 2023-08-28 | Disposition: A | Discharge status: Home / Self Care | Payer: BLUE CROSS/BLUE SHIELD | Source: Ambulatory Visit | Attending: Family Medicine | Admitting: Family Medicine

## 2023-08-28 DIAGNOSIS — Z1231 Encounter for screening mammogram for malignant neoplasm of breast: Secondary | ICD-10-CM | POA: Diagnosis present

## 2024-01-01 IMAGING — MG MM DIGITAL SCREENING BILAT W/ TOMO AND CAD
8 series · 8 of 24 positions shown · non-contrast
Comparison: Previous exam(s).

CLINICAL DATA: Screening.

EXAM:
DIGITAL SCREENING BILATERAL MAMMOGRAM WITH TOMOSYNTHESIS AND CAD
TECHNIQUE: Bilateral screening digital craniocaudal and mediolateral oblique
mammograms were obtained. Bilateral screening digital breast
tomosynthesis was performed. The images were evaluated with
computer-aided detection.

[L CC synth-2D]
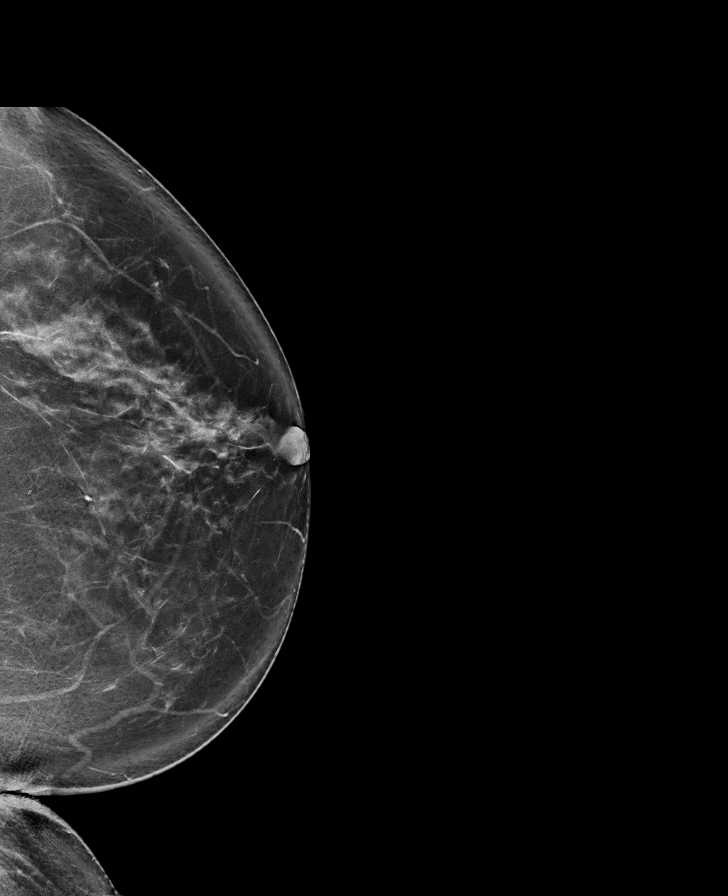

[R MLO synth-2D]
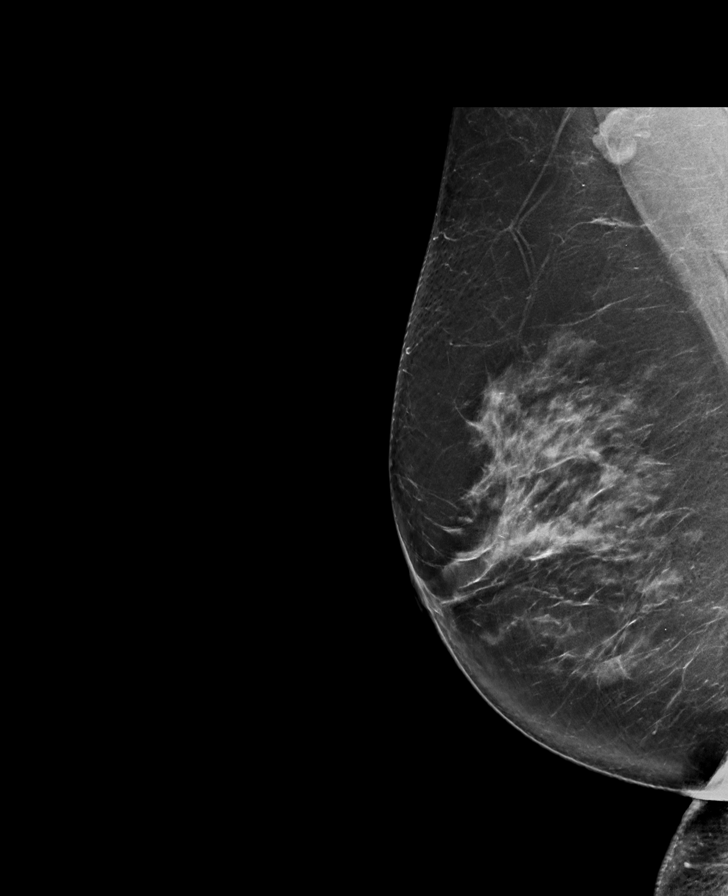

[L MLO synth-2D]
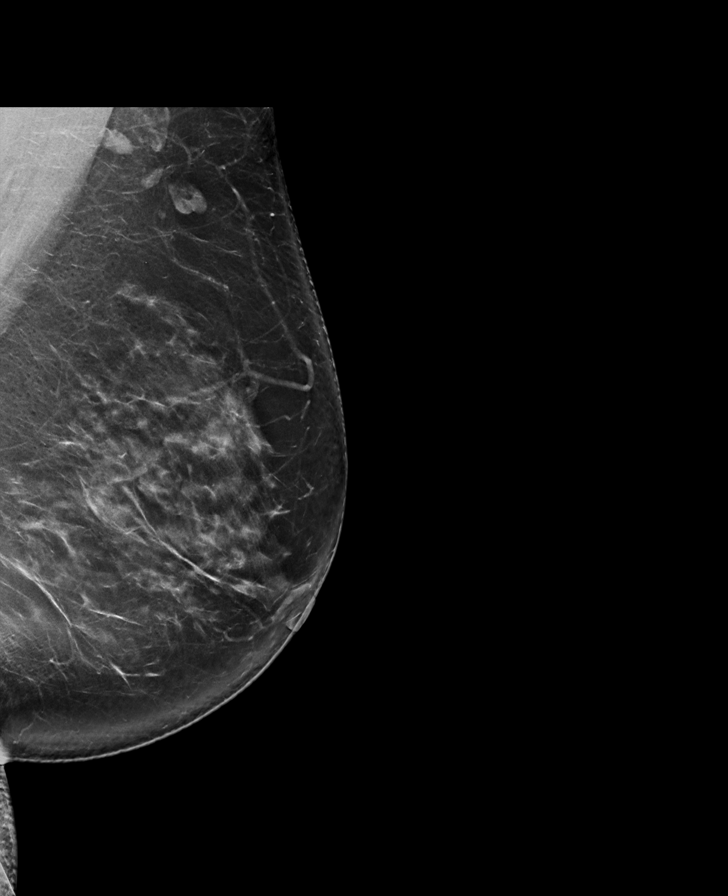

[R CC synth-2D]
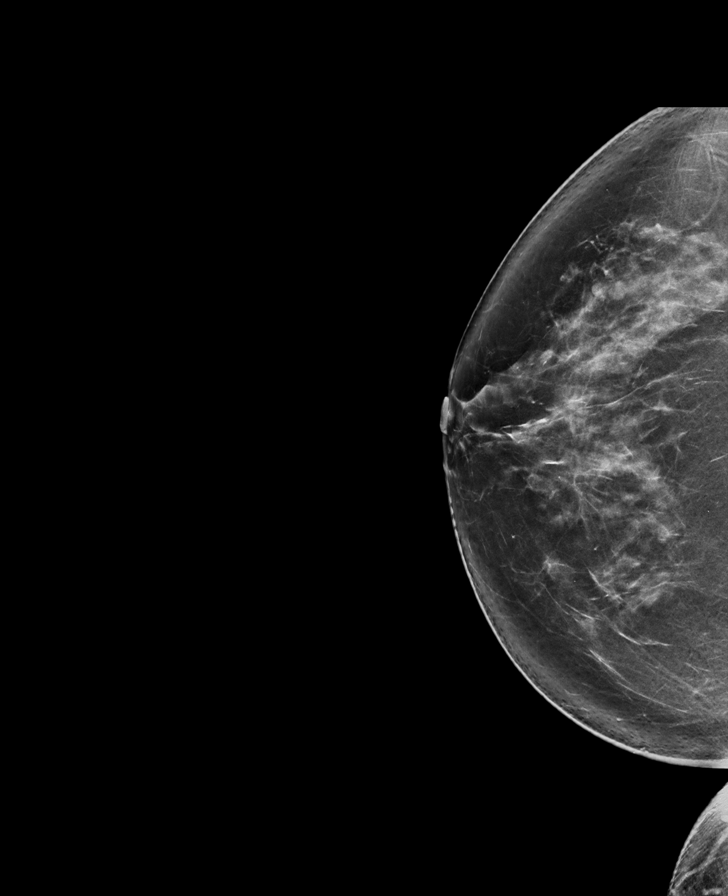

[R MLO tomo · tomo slice 45/89.0]
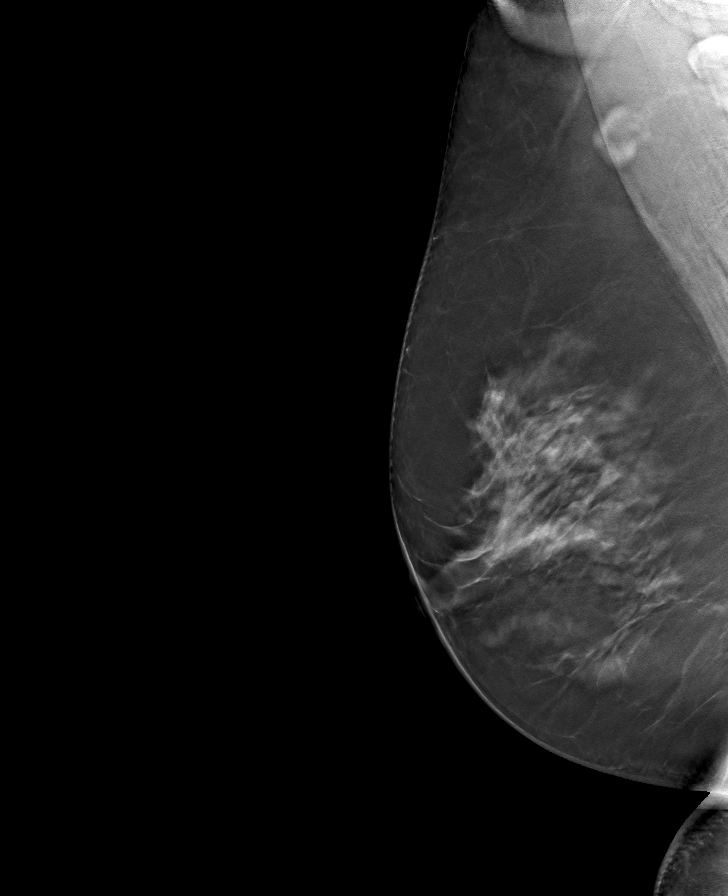

[R CC tomo · tomo slice 45/88.0]
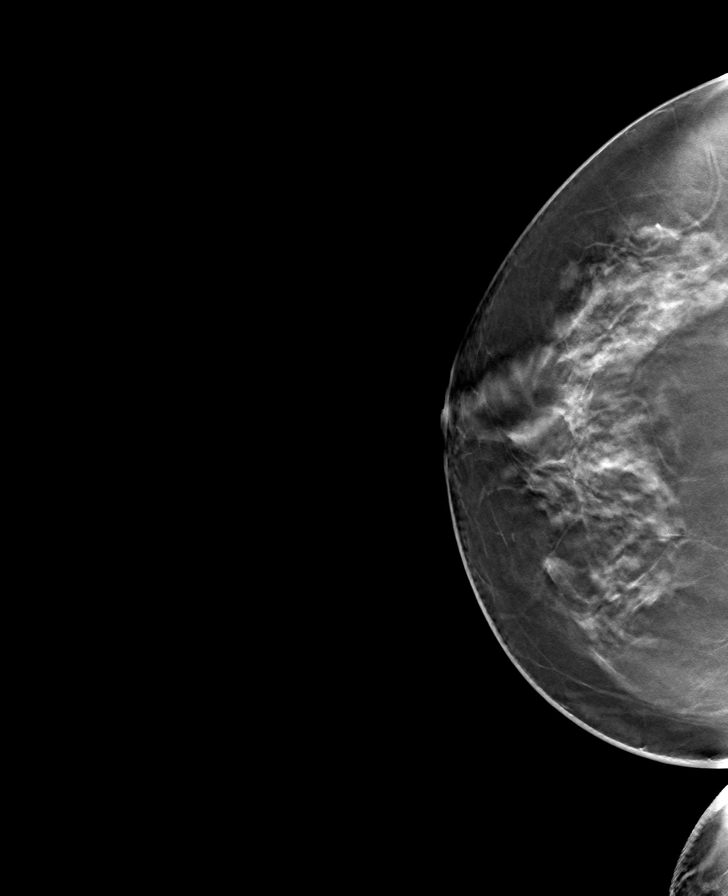

[L CC tomo · tomo slice 45/89.0]
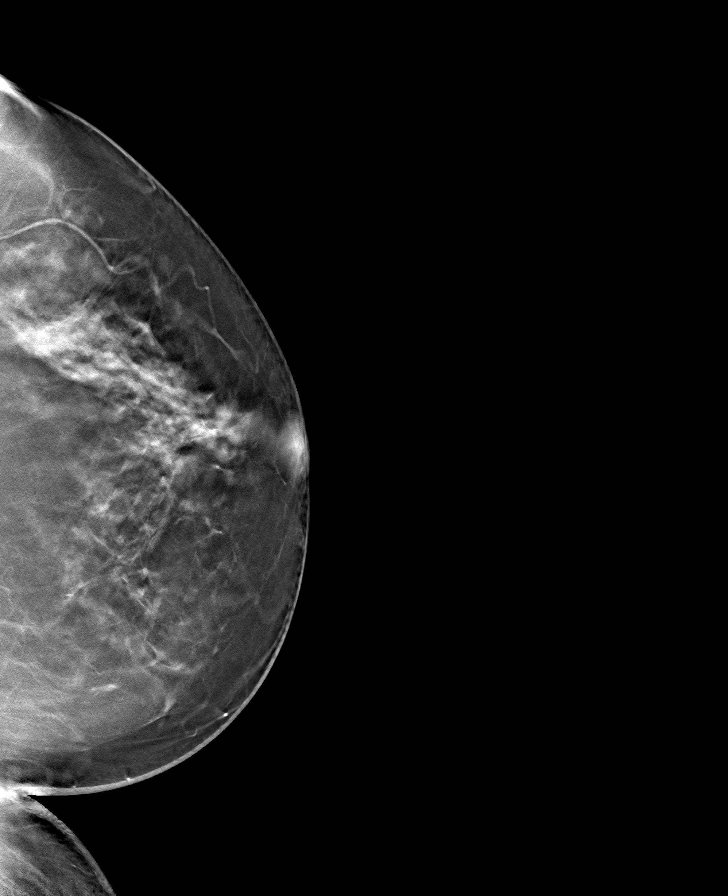

[L MLO tomo · tomo slice 47/94.0]
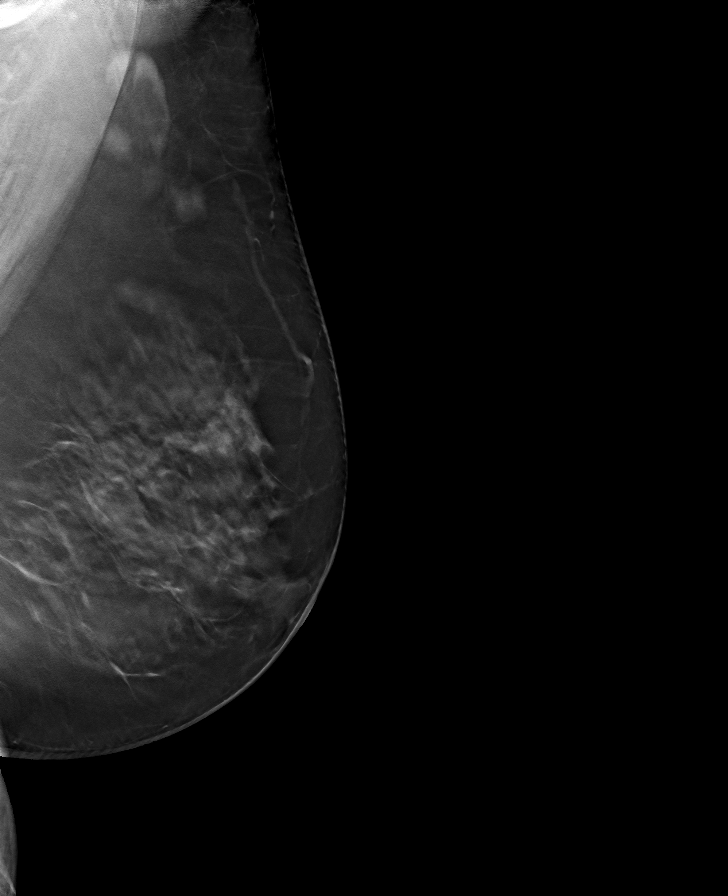

[8 of 24 positions shown; findings below may reference images not displayed]

ACR Breast Density Category c: The breast tissue is heterogeneously
dense, which may obscure small masses.
FINDINGS: There are no findings suspicious for malignancy.
IMPRESSION: No mammographic evidence of malignancy. A result letter of this
screening mammogram will be mailed directly to the patient.

RECOMMENDATION:
Screening mammogram in one year. (Code:Q3-W-BC3)

BI-RADS CATEGORY  1: Negative.
